# Patient Record
Sex: Female | Born: 1965 | Race: White | Hispanic: No | Marital: Married | Smoking: Former smoker
Health system: Southern US, Community
[De-identification: ages and names within clinical notes are randomized; demographics above are authoritative.]

---

## 2015-01-04 ENCOUNTER — Encounter: Payer: Self-pay | Admitting: Podiatry

## 2015-01-04 ENCOUNTER — Ambulatory Visit (INDEPENDENT_AMBULATORY_CARE_PROVIDER_SITE_OTHER): Payer: BC Managed Care – PPO | Admitting: Podiatry

## 2015-01-04 ENCOUNTER — Ambulatory Visit (INDEPENDENT_AMBULATORY_CARE_PROVIDER_SITE_OTHER): Payer: BC Managed Care – PPO

## 2015-01-04 VITALS — BP 111/66 | HR 73 | Resp 16 | Ht 64.5 in | Wt 145.0 lb

## 2015-01-04 DIAGNOSIS — D3610 Benign neoplasm of peripheral nerves and autonomic nervous system, unspecified: Secondary | ICD-10-CM

## 2015-01-04 DIAGNOSIS — M722 Plantar fascial fibromatosis: Secondary | ICD-10-CM | POA: Diagnosis not present

## 2015-01-04 MED ORDER — METHYLPREDNISOLONE 4 MG PO TBPK: ORAL_TABLET | ORAL | Status: DC

## 2015-01-04 MED ORDER — MELOXICAM 15 MG PO TABS: 15.0000 mg | ORAL_TABLET | Freq: Every day | ORAL | Status: DC

## 2015-01-04 NOTE — Progress Notes (Signed)
   Subjective:    Patient ID: Carrie Garner, female    DOB: [birth year], 49 y.o.   MRN: [id number]  HPI Comments: "I have pain in the arch and heel"  Patient c/o aching plantar heel and arch right for 2 years. She has AM pain and a knot in arch. She has tried ice-no better.  Also, concerned about numbness in 3rd and 4th toes right and tenderness plantar forefoot right.  Foot Pain Associated symptoms include numbness.  Toe Pain  Associated symptoms include numbness.      Review of Systems  Musculoskeletal: Positive for gait problem.  Neurological: Positive for numbness.  All other systems reviewed and are negative.      Objective:   Physical Exam: I have reviewed her past medical history medications out a surgery social history and review of systems. Pulses are strongly palpable. Neurologic sensorium is intact per Semmes-Weinstein monofilament. Mild tenderness on palpation of the third interdigital space with a palpable Mulder's click indicative of a neuroma. She states this seems to be resolving. Deep tendon reflexes are brisk and equal bilateral. And muscle strength including all intrinsic musculature appears to be intact bilaterally. Orthopedic evaluation demonstrates anal palpation medial calcaneal tubercle of the right heel. Mild tenderness on palpation of the second and third metatarsophalangeal joints possibly associated with compensation. She does have a small plantar fibroma to the distalmost aspect of the medial band of the plantar fascia this is not painful on palpation. Radiograph demonstrates rectus foot type with soft tissue increase in density of the plantar fascial calcaneal insertion site of the right heel. No other major osseous abnormalities are noted. Cutaneous evaluation demonstrates supple well-hydrated cutis no erythema edema cellulitis drainage or odor or open wounds.        Assessment & Plan:  Assessment: Plantar fasciitis with lateral compensatory syndrome  resulting in neuroma type symptoms right. Capsulitis second third digits of the right foot. 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  Plan: Discussed etiology and pathology conservative versus surgical therapies. We discussed appropriate shoe gear stretching exercises ice therapy and sugar modifications. I injected the right heel today with Kenalog and local and aesthetic. I also started her on a Medrol Dosepak to be followed by meloxicam. We dispensed her fascial brace and a night splint. I will follow up with her in 1 month. We may need to consider orthotics at that time.

## 2015-01-04 NOTE — Patient Instructions (Signed)

## 2015-02-03 ENCOUNTER — Encounter: Payer: BC Managed Care – PPO | Admitting: Podiatry

## 2015-02-11 ENCOUNTER — Telehealth: Payer: Self-pay | Admitting: *Deleted

## 2015-02-11 NOTE — Telephone Encounter (Signed)
Pt states she is continuing to have pain in the right foot, but had read it was dangerous to get another cortisone injection.  I spoke with pt and informed that injections spaces periods of times apart were beneficial, and there were other options that may be used to help give comfort and support to her feet.  I transferred pt to schedulers.

## 2015-02-15 ENCOUNTER — Telehealth: Payer: Self-pay | Admitting: *Deleted

## 2015-02-15 ENCOUNTER — Ambulatory Visit (INDEPENDENT_AMBULATORY_CARE_PROVIDER_SITE_OTHER): Payer: BC Managed Care – PPO | Admitting: Podiatry

## 2015-02-15 ENCOUNTER — Encounter: Payer: Self-pay | Admitting: Podiatry

## 2015-02-15 VITALS — BP 100/65 | HR 84 | Resp 16

## 2015-02-15 DIAGNOSIS — M722 Plantar fascial fibromatosis: Secondary | ICD-10-CM | POA: Diagnosis not present

## 2015-02-15 DIAGNOSIS — D3610 Benign neoplasm of peripheral nerves and autonomic nervous system, unspecified: Secondary | ICD-10-CM | POA: Diagnosis not present

## 2015-02-15 NOTE — Progress Notes (Addendum)
She presents today for follow-up for plantar fasciitis. She states that it may only be approximately 25% improved but do not wear my night splint consistently nor have I been taking the meloxicam. She states that she is worried about taking the meloxicam due to risk of kidney and stomach issues.  Objective: Vital signs are stable she is alert and oriented 3 she appears to be good nutritional status and good overall general appearance. She presents with her sandals on today. She still has pain on palpation medial calcaneal tubercle of the right heel. Pulses are strongly palpable. Neurologic sensorium is intact. Muscle strength +5 over 5 dorsiflexion plantar flexors and inverters everters all into the musculature is intact. Orthopedic evaluation demonstrates pain on palpation medial calcaneal tubercle of the right heel. Cutaneous evaluation demonstrates supple well-hydrated cutis there is no erythema edema cellulitis drainage or odor and the nail plates appear to be normal. She has longer has pain on palpation to the toes of the forefoot right.  Assessment: Resolving capsulitis and neuroma forefoot right. Minimal resolution of plantar fasciitis right heel most likely secondary to noncompliance.  Plan: Discussed etiology pathology conservative versus surgical therapies. I rewrote the prescription for her meloxicam. Reinjected her right heel today. I encouraged the use of her plantar fascial brace and continue use of the night splint at all times. I will follow-up with her in 1 month at which time we'll consider orthotics and/or physical therapy.    [place] DPM

## 2015-02-15 NOTE — Telephone Encounter (Signed)
Pt asked if she would be able to take the Mobic now since she had a prednisone injection today.  I told the pt it would be fine to begin the Mobic today.

## 2015-03-15 ENCOUNTER — Ambulatory Visit: Payer: BC Managed Care – PPO | Admitting: Podiatry

## 2015-03-17 ENCOUNTER — Ambulatory Visit: Payer: BC Managed Care – PPO | Admitting: Podiatry

## 2019-07-20 ENCOUNTER — Encounter: Payer: Self-pay | Admitting: Emergency Medicine

## 2019-07-20 ENCOUNTER — Emergency Department (INDEPENDENT_AMBULATORY_CARE_PROVIDER_SITE_OTHER): Payer: BC Managed Care – PPO

## 2019-07-20 ENCOUNTER — Other Ambulatory Visit: Payer: Self-pay

## 2019-07-20 ENCOUNTER — Emergency Department (INDEPENDENT_AMBULATORY_CARE_PROVIDER_SITE_OTHER)
Admission: EM | Admit: 2019-07-20 | Discharge: 2019-07-20 | Disposition: A | Discharge status: Home / Self Care | Payer: BC Managed Care – PPO | Source: Home / Self Care

## 2019-07-20 DIAGNOSIS — R52 Pain, unspecified: Secondary | ICD-10-CM

## 2019-07-20 DIAGNOSIS — G8929 Other chronic pain: Secondary | ICD-10-CM

## 2019-07-20 DIAGNOSIS — R1011 Right upper quadrant pain: Secondary | ICD-10-CM

## 2019-07-20 LAB — COMPLETE METABOLIC PANEL WITH GFR
AG Ratio: 1.6 (calc) (ref 1.0–2.5)
ALT: 24 U/L (ref 6–29)
AST: 17 U/L (ref 10–35)
Albumin: 4.4 g/dL (ref 3.6–5.1)
Alkaline phosphatase (APISO): 95 U/L (ref 37–153)
BUN: 11 mg/dL (ref 7–25)
CO2: 28 mmol/L (ref 20–32)
Calcium: 10 mg/dL (ref 8.6–10.4)
Chloride: 103 mmol/L (ref 98–110)
Creat: 0.77 mg/dL (ref 0.50–1.05)
GFR, Est African American: 101 mL/min/{1.73_m2} (ref >=60)
GFR, Est Non African American: 87 mL/min/{1.73_m2} (ref >=60)
Globulin: 2.7 g/dL (calc) (ref 1.9–3.7)
Glucose, Bld: 100 mg/dL — ABNORMAL HIGH (ref 65–99)
Potassium: 4.4 mmol/L (ref 3.5–5.3)
Sodium: 141 mmol/L (ref 135–146)
Total Bilirubin: 0.5 mg/dL (ref 0.2–1.2)
Total Protein: 7.1 g/dL (ref 6.1–8.1)

## 2019-07-20 NOTE — ED Provider Notes (Signed)
[facility]    CSN: [id number] Arrival date & time: 07/20/19  [id number]      History   Chief Complaint Chief Complaint  Patient presents with  . Abdominal Pain    HPI Carrie Garner is a 54 y.o. female.   Presents with right upper quadrant pain.  She has had this intermittently over the past several years.  She denies any food intolerances.  Had some nausea but no vomiting.  Denies any cough or congestion  HPI  No past medical history on file.  There are no problems to display for this patient.     OB History   No obstetric history on file.      Home Medications    Prior to Admission medications   Not on File    Family History Family History  Problem Relation Age of Onset  . Stroke Mother   . Multiple sclerosis Mother   . Cancer Father     Social History Social History   Tobacco Use  . Smoking status: Former Smoker    Types: Cigarettes    Quit date: 07/19/1998    Years since quitting: 21.0  Substance Use Topics  . Alcohol use: Yes    Alcohol/week: 0.0 standard drinks    Comment: occas  . Drug use: Not on file     Allergies   Patient has no known allergies.   Review of Systems Review of Systems  Gastrointestinal: Positive for abdominal pain and nausea.  All other systems reviewed and are negative.    Physical Exam Triage Vital Signs ED Triage Vitals  Enc Vitals Group     BP 07/20/19 1006 112/77     Pulse Rate 07/20/19 1006 86     Resp --      Temp 07/20/19 1006 98.8 F (37.1 C)     Temp Source 07/20/19 1006 Oral     SpO2 07/20/19 1006 97 %     Weight 07/20/19 1007 147 lb (66.7 kg)     Height 07/20/19 1007 5\' 4"  (1.626 m)     Head Circumference --      Peak Flow --      Pain Score 07/20/19 1007 7     Pain Loc --      Pain Edu? --      Excl. in [place]? --    No data found.  Updated Vital Signs BP 112/77 (BP Location: Right Arm)   Pulse 86   Temp 98.8 F (37.1 C) (Oral)   Ht 5\' 4"  (1.626 m)   Wt 66.7 kg   SpO2 97%    BMI 25.23 kg/m   Visual Acuity Right Eye Distance:   Left Eye Distance:   Bilateral Distance:    Right Eye Near:   Left Eye Near:    Bilateral Near:     Physical Exam Vitals and nursing note reviewed.  Constitutional:      Appearance: She is well-developed.  HENT:     Head: Normocephalic.  Cardiovascular:     Rate and Rhythm: Normal rate and regular rhythm.  Pulmonary:     Effort: Pulmonary effort is normal.     Breath sounds: Normal breath sounds.  Abdominal:     General: Abdomen is flat. Bowel sounds are normal.     Palpations: Abdomen is soft. There is no hepatomegaly.     Tenderness: There is generalized abdominal tenderness.  Neurological:     Mental Status: She is alert.  UC Treatments / Results  Labs (all labs ordered are listed, but only abnormal results are displayed) Labs Reviewed - No data to display  EKG   Radiology No results found.  Procedures Procedures (including critical care time)  Medications Ordered in UC Medications - No data to display  Initial Impression / Assessment and Plan / UC Course  I have reviewed the triage vital signs and the nursing notes.  Pertinent labs & imaging results that were available during my care of the patient were reviewed by me and considered in my medical decision making (see chart for details).     Abdominal pain, right upper quadrant.  Since we have access to ultrasound will order test to rule out gallstones. Final Clinical Impressions(s) / UC Diagnoses   Final diagnoses:  None   Discharge Instructions   None    ED Prescriptions    None     PDMP not reviewed this encounter.   Wardell Honour, MD 07/20/19 1048

## 2019-07-20 NOTE — ED Triage Notes (Signed)
Rt abdominal pain, thinks its a gall bladder attack started last night, has had problems in the past.

## 2021-09-15 IMAGING — US US ABDOMEN LIMITED
1 series · 14 of 25 positions shown · non-contrast
Comparison: None.

CLINICAL DATA: Acute right upper quadrant abdominal pain.

EXAM:
ULTRASOUND ABDOMEN LIMITED RIGHT UPPER QUADRANT

[Series 1: us abdomen limited · 0.15mm/px · 14 of 71 slices shown]
[im 1/71]
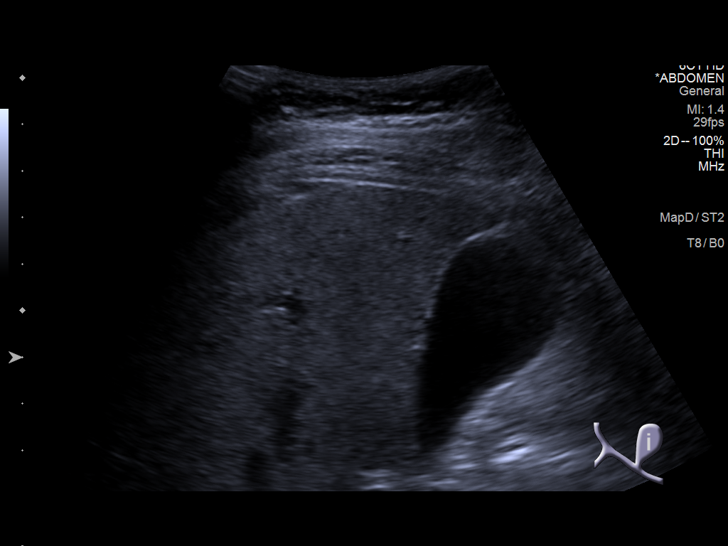
[im 6/71]
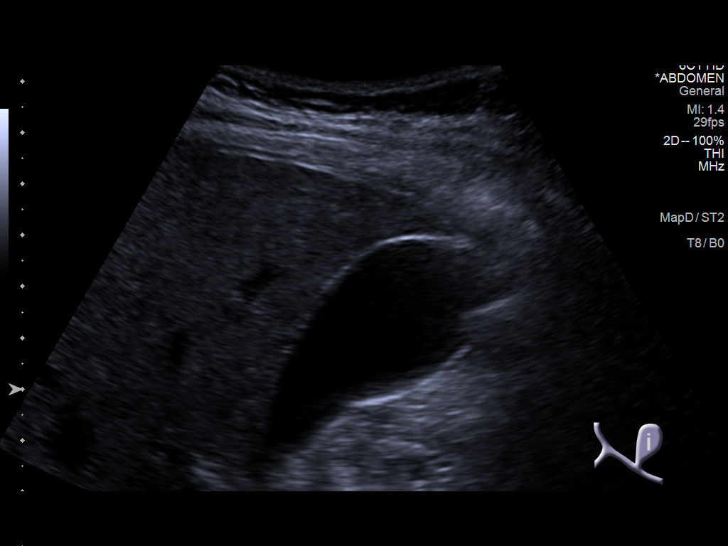
[im 12/71]
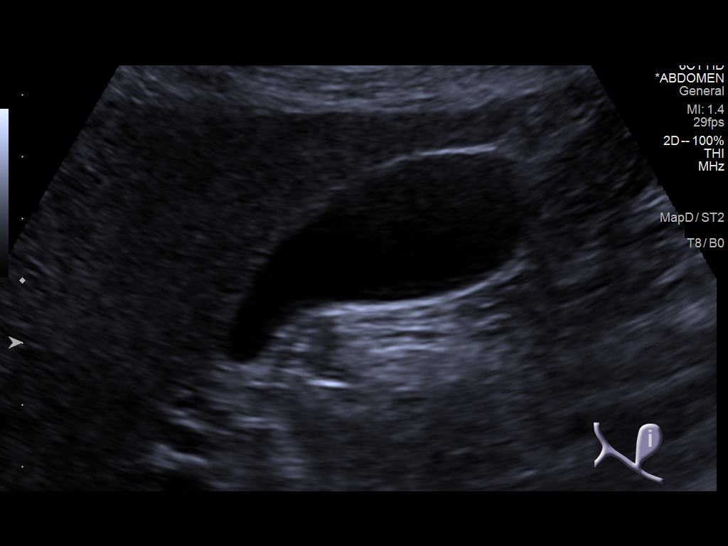
[im 18/71]
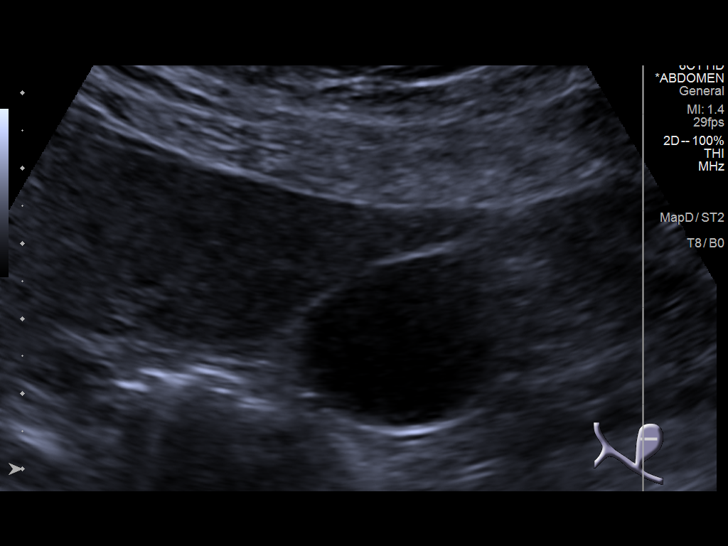
[im 24/71]
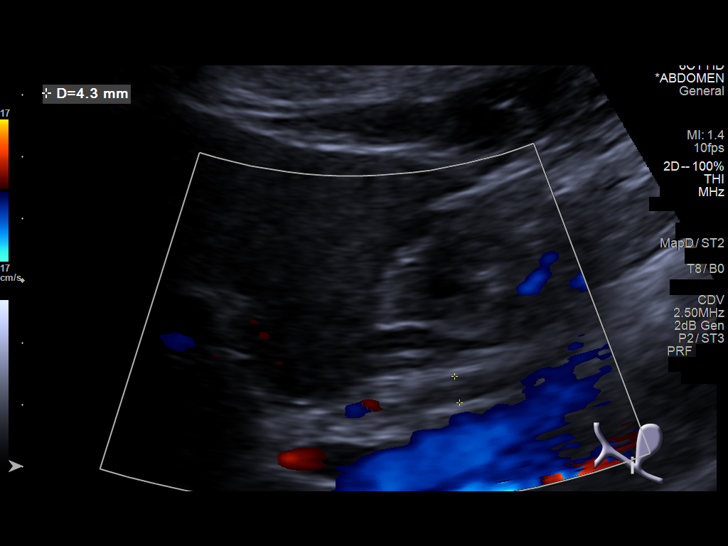
[im 27/71]
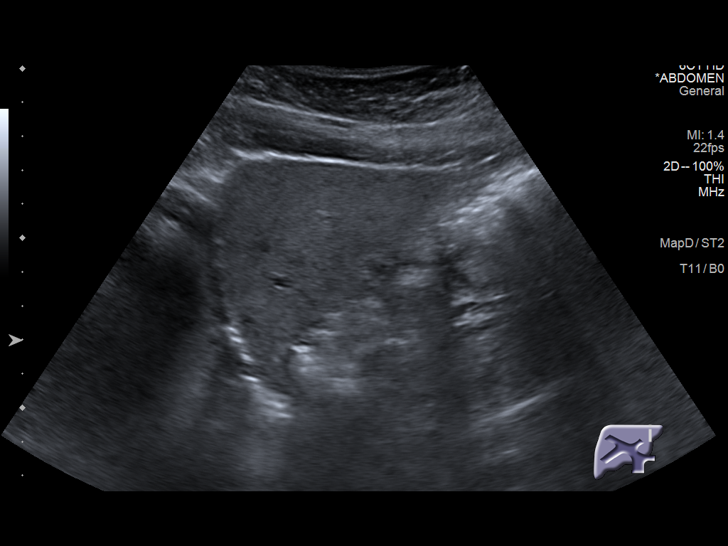
[im 33/71]
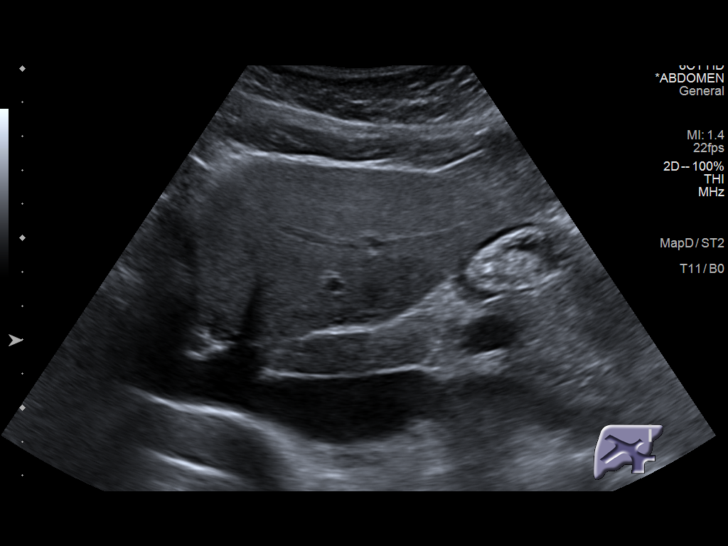
[im 38/71]
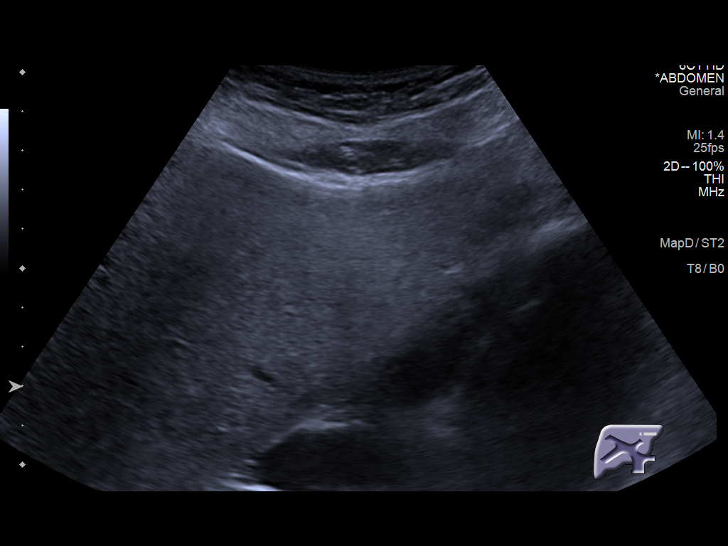
[im 44/71]
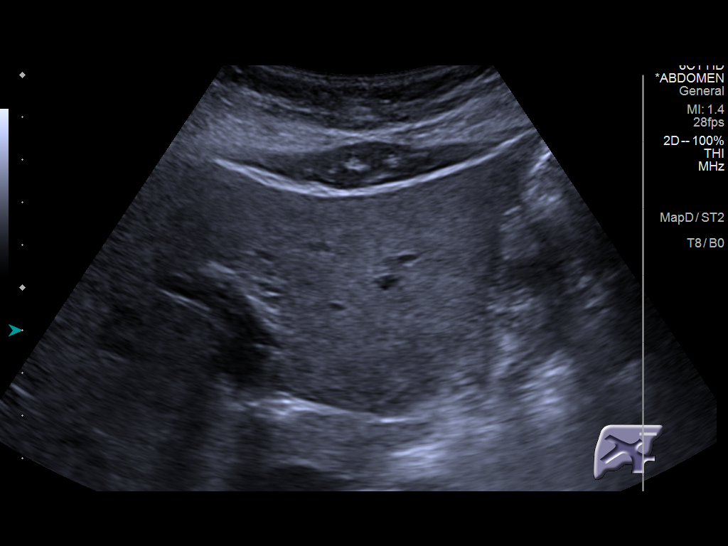
[im 47/71]
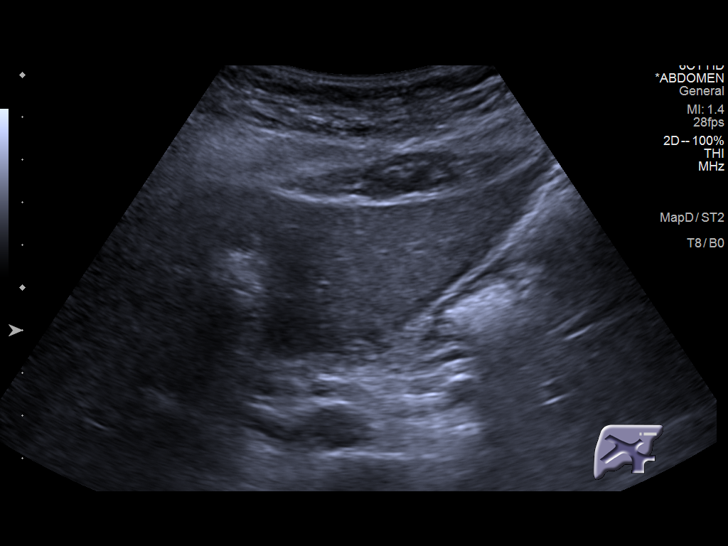
[im 53/71]
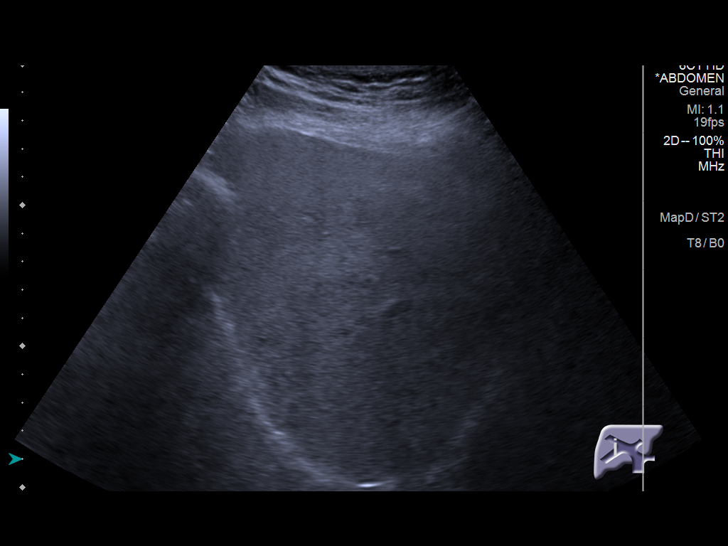
[im 59/71]
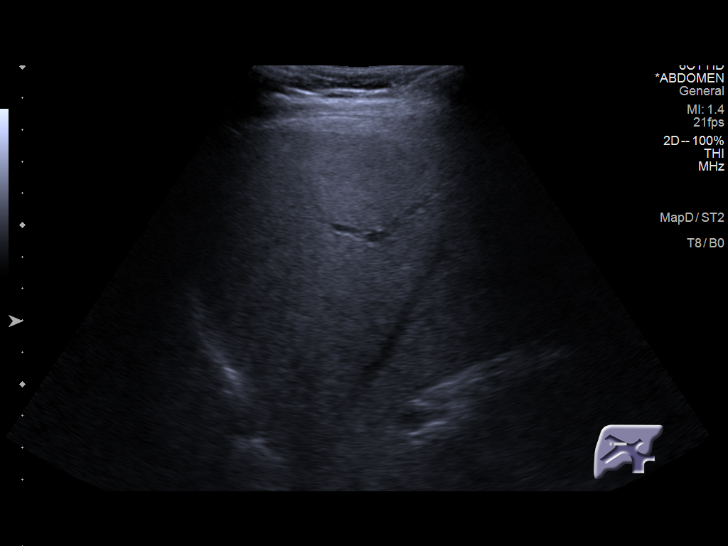
[im 65/71]
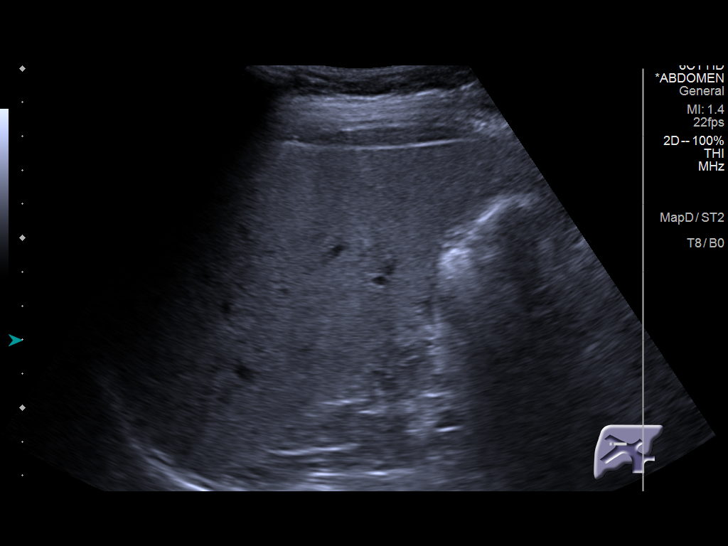
[im 71/71]
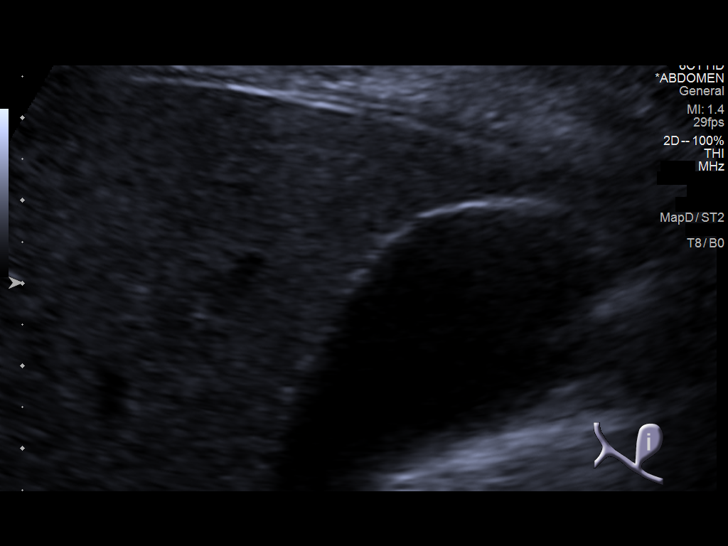

[14 of 25 positions shown; findings below may reference images not displayed]

FINDINGS: Gallbladder:

No gallstones or wall thickening visualized. No sonographic Murphy
sign noted by sonographer.

Common bile duct:

Diameter: 5 mm which is within normal limits.

Liver:

No focal lesion identified. Within normal limits in parenchymal
echogenicity. Portal vein is patent on color Doppler imaging with
normal direction of blood flow towards the liver.

Other: None.
IMPRESSION: No abnormality seen in the right upper quadrant of the abdomen.
